# Patient Record
Sex: Female | Born: 1980 | Race: Black or African American | Hispanic: No | Marital: Married | Smoking: Never smoker
Health system: Southern US, Community
[De-identification: ages and names within clinical notes are randomized; demographics above are authoritative.]

---

## 2016-02-27 ENCOUNTER — Emergency Department: Payer: Medicaid Other

## 2016-02-27 ENCOUNTER — Encounter: Payer: Self-pay | Admitting: Radiology

## 2016-02-27 ENCOUNTER — Emergency Department
Admission: EM | Admit: 2016-02-27 | Discharge: 2016-02-27 | Disposition: A | Discharge status: Home / Self Care | Payer: Medicaid Other | Attending: Emergency Medicine | Admitting: Emergency Medicine

## 2016-02-27 DIAGNOSIS — O26892 Other specified pregnancy related conditions, second trimester: Secondary | ICD-10-CM | POA: Diagnosis not present

## 2016-02-27 DIAGNOSIS — N9489 Other specified conditions associated with female genital organs and menstrual cycle: Secondary | ICD-10-CM | POA: Insufficient documentation

## 2016-02-27 DIAGNOSIS — R8271 Bacteriuria: Secondary | ICD-10-CM | POA: Insufficient documentation

## 2016-02-27 DIAGNOSIS — O4692 Antepartum hemorrhage, unspecified, second trimester: Secondary | ICD-10-CM

## 2016-02-27 DIAGNOSIS — O9989 Other specified diseases and conditions complicating pregnancy, childbirth and the puerperium: Secondary | ICD-10-CM

## 2016-02-27 DIAGNOSIS — O99891 Other specified diseases and conditions complicating pregnancy: Secondary | ICD-10-CM

## 2016-02-27 DIAGNOSIS — Z3A22 22 weeks gestation of pregnancy: Secondary | ICD-10-CM | POA: Insufficient documentation

## 2016-02-27 DIAGNOSIS — O212 Late vomiting of pregnancy: Secondary | ICD-10-CM | POA: Diagnosis present

## 2016-02-27 LAB — BASIC METABOLIC PANEL
ANION GAP: 9 (ref 5–15)
BUN: 12 mg/dL (ref 6–20)
CHLORIDE: 105 mmol/L (ref 101–111)
CO2: 22 mmol/L (ref 22–32)
Calcium: 9 mg/dL (ref 8.9–10.3)
Creatinine, Ser: 0.56 mg/dL (ref 0.44–1.00)
GFR calc Af Amer: >60 mL/min (ref >60)
GLUCOSE: 118 mg/dL — AB (ref 65–99)
POTASSIUM: 3.9 mmol/L (ref 3.5–5.1)
Sodium: 136 mmol/L (ref 135–145)

## 2016-02-27 LAB — URINALYSIS COMPLETE WITH MICROSCOPIC (ARMC ONLY)
BILIRUBIN URINE: NEGATIVE
Glucose, UA: NEGATIVE mg/dL
KETONES UR: NEGATIVE mg/dL
Nitrite: NEGATIVE
Protein, ur: NEGATIVE mg/dL
SPECIFIC GRAVITY, URINE: 1.003 — AB (ref 1.005–1.030)
pH: 7 (ref 5.0–8.0)

## 2016-02-27 LAB — CBC
HEMATOCRIT: 37 % (ref 35.0–47.0)
HEMOGLOBIN: 12.3 g/dL (ref 12.0–16.0)
MCH: 27.1 pg (ref 26.0–34.0)
MCHC: 33.2 g/dL (ref 32.0–36.0)
MCV: 81.7 fL (ref 80.0–100.0)
Platelets: 332 10*3/uL (ref 150–440)
RBC: 4.53 MIL/uL (ref 3.80–5.20)
RDW: 14.5 % (ref 11.5–14.5)
WBC: 10 10*3/uL (ref 3.6–11.0)

## 2016-02-27 LAB — TYPE AND SCREEN
ABO/RH(D): O POS
Antibody Screen: NEGATIVE

## 2016-02-27 LAB — POCT PREGNANCY, URINE: Preg Test, Ur: POSITIVE — AB

## 2016-02-27 LAB — HCG, QUANTITATIVE, PREGNANCY: hCG, Beta Chain, Quant, S: 18406 m[IU]/mL — ABNORMAL HIGH (ref <5)

## 2016-02-27 MED ORDER — NITROFURANTOIN MONOHYD MACRO 100 MG PO CAPS: 100.0000 mg | ORAL_CAPSULE | Freq: Two times a day (BID) | ORAL | 0 refills | Status: AC

## 2016-02-27 NOTE — Discharge Instructions (Signed)
According to our ultrasound you are [redacted] weeks pregnant. Please follow-up with the health department for further evaluation. He is return with any worsening symptoms.

## 2016-02-27 NOTE — ED Triage Notes (Signed)
Pt states that she is having some burning with urination that started today, pt also states that she noticed some blood when she wiped, pt reports recently finding out that she is 14-[redacted] weeks pregnant, lmp '04

## 2016-02-27 NOTE — ED Provider Notes (Signed)
[facility] Emergency Department Provider Note   ____________________________________________   First MD Initiated Contact with Patient 02/27/16 [phone]     (approximate)  I have reviewed the triage vital signs and the nursing notes.   HISTORY  Chief Complaint Vaginal Bleeding    HPI Hailey Walker is a 35 y.o. female who comes into the hospital today with spotting. The patient reports that she wiped and saw that there was some blood on the tissue. The patient reports that she found out last Thursday she was pregnant. She reports that she was around 14-[redacted] weeks pregnant but they were not sure.The patient is a G2P1001. The patient reports that she spotted and then it stopped and then spotting started again. The patient denies any abdominal pain or back pain. She's had no fever or headache. She has had some nausea and vomiting. Given the symptoms she wanted to come in and get checked out.   History reviewed. No pertinent past medical history.  There are no active problems to display for this patient.   History reviewed. No pertinent surgical history.  Prior to Admission medications   Medication Sig Start Date End Date Taking? Authorizing Provider  nitrofurantoin, macrocrystal-monohydrate, (MACROBID) 100 MG capsule Take 1 capsule (100 mg total) by mouth 2 (two) times daily. 02/27/16 03/05/16  Rebecka Apley, MD    Allergies Review of patient's allergies indicates no known allergies.  No family history on file.  Social History Social History  Substance Use Topics  . Smoking status: Never Smoker  . Smokeless tobacco: Not on file  . Alcohol use No    Review of Systems Constitutional: No fever/chills Eyes: No visual changes. ENT: No sore throat. Cardiovascular: Denies chest pain. Respiratory: Denies shortness of breath. Gastrointestinal: No abdominal pain.  No nausea, no vomiting.  No diarrhea.  No constipation. Genitourinary: Vaginal  bleeding Musculoskeletal: Negative for back pain. Skin: Negative for rash. Neurological: Negative for headaches, focal weakness or numbness.  10-point ROS otherwise negative.  ____________________________________________   PHYSICAL EXAM:  VITAL SIGNS: ED Triage Vitals  Enc Vitals Group     BP 02/27/16 0118 137/84     Pulse Rate 02/27/16 0118 (!) 103     Resp 02/27/16 0118 18     Temp 02/27/16 0118 98.7 F (37.1 C)     Temp Source 02/27/16 0118 Oral     SpO2 02/27/16 0118 98 %     Weight 02/27/16 0124 248 lb (112.5 kg)     Height 02/27/16 0124 4\' 9"  (1.448 m)     Head Circumference --      Peak Flow --      Pain Score --      Pain Loc --      Pain Edu? --      Excl. in GC? --     Constitutional: Alert and oriented. Well appearing and in no acute distress. Eyes: Conjunctivae are normal. PERRL. EOMI. Head: Atraumatic. Nose: No congestion/rhinnorhea. Mouth/Throat: Mucous membranes are moist.  Oropharynx non-erythematous. Cardiovascular: Normal rate, regular rhythm. Grossly normal heart sounds.  Good peripheral circulation. Respiratory: Normal respiratory effort.  No retractions. Lungs CTAB. Gastrointestinal: Soft and nontender. No distention. Positive bowel sounds Musculoskeletal: No lower extremity tenderness nor edema.   Neurologic:  Normal speech and language.  Skin:  Skin is warm, dry and intact.  Psychiatric: Mood and affect are normal.   ____________________________________________   LABS (all labs ordered are listed, but only abnormal results are displayed)  Labs Reviewed  URINALYSIS COMPLETEWITH MICROSCOPIC ([facility] ONLY) - Abnormal; Notable for the following:       Result Value   Color, Urine YELLOW (*)    APPearance CLOUDY (*)    Specific Gravity, Urine 1.003 (*)    Hgb urine dipstick 2+ (*)    Leukocytes, UA 2+ (*)    Bacteria, UA MANY (*)    Squamous Epithelial / LPF 6-30 (*)    All other components within normal limits  HCG, QUANTITATIVE, PREGNANCY  - Abnormal; Notable for the following:    hCG, Beta Chain, Quant, S 18,406 (*)    All other components within normal limits  BASIC METABOLIC PANEL - Abnormal; Notable for the following:    Glucose, Bld 118 (*)    All other components within normal limits  POCT PREGNANCY, URINE - Abnormal; Notable for the following:    Preg Test, Ur POSITIVE (*)    All other components within normal limits  CBC  POC URINE PREG, ED  TYPE AND SCREEN   ____________________________________________  EKG  none ____________________________________________  RADIOLOGY  US pelvis ____________________________________________   PROCEDURES  Procedure(s) performed: None  Procedures  Critical Care performed: No  ____________________________________________   INITIAL IMPRESSION / ASSESSMENT AND PLAN / ED COURSE  Pertinent labs & imaging results that were available during my care of the patient were reviewed by me and considered in my medical decision making (see chart for details).  This is a 35 year old female who comes into the hospital today with some vaginal spotting. The patient is 14-16 weeks' pregnant. She denies any pain at this time. Looking at the patient's urinalysis appears as though she has a urinary tract infection or at least asymptomatic bacteriuria. I will send the patient for an ultrasound as well as perform a pelvic exam. I will prescribe the patient some nitrofurantoin and have her follow back up with her OB/GYN. The patient will be reassessed once I received the results of her ultrasound.  Clinical Course  Value Comment By Time  US OB Limited Single live intrauterine pregnancy. Estimated gestational age of [redacted] weeks and 2 days by BPD. No acute process identified.  This exam is performed on an emergent basis and does not comprehensively evaluate fetal size, dating, or anatomy; follow-up complete OB US should be considered if further fetal assessment is warranted.   Rebecka Apley, MD 10/02 [phone]   I discussed the results of the ultrasound with the patient. She reports that she did not have accurate dates. She reports that they simply listen to her fetal heart tones and felt her uterus size and estimated gestational age. Given that the patient is in her second trimester I will hold off on doing a pelvic exam as to not introduce infection or cause more bleeding. The patient will be treated for asymptomatic bacteriuria and she has no further bleeding or pain at this time. I will have the patient evaluated as she has an appointment on Thursday. The patient will be discharged home to follow-up with her OB/GYN.  ____________________________________________   FINAL CLINICAL IMPRESSION(S) / ED DIAGNOSES  Final diagnoses:  Asymptomatic bacteriuria during pregnancy  Vaginal bleeding in pregnancy, second trimester      NEW MEDICATIONS STARTED DURING THIS VISIT:  Discharge Medication List as of 02/27/2016  7:30 AM    START taking these medications   Details  nitrofurantoin, macrocrystal-monohydrate, (MACROBID) 100 MG capsule Take 1 capsule (100 mg total) by mouth 2 (two) times daily., Starting Mon 02/27/2016, Until Mon 03/05/2016,  Print         Note:  This document was prepared using Dragon voice recognition software and may include unintentional dictation errors.    Rebecka ApleyAllison P Abeer Deskins, MD 02/27/16 [phone]

## 2018-08-23 IMAGING — US US OB LIMITED
1 series · 14 of 28 positions shown · non-contrast
Comparison: none

CLINICAL DATA: 35 y/o F; pregnant patient with spotting since
yesterday afternoon.

EXAM:
LIMITED OBSTETRIC ULTRASOUND

[Series 1: us ob limited · 0.30mm/px · 14 of 35 slices shown]
[im 2/35]
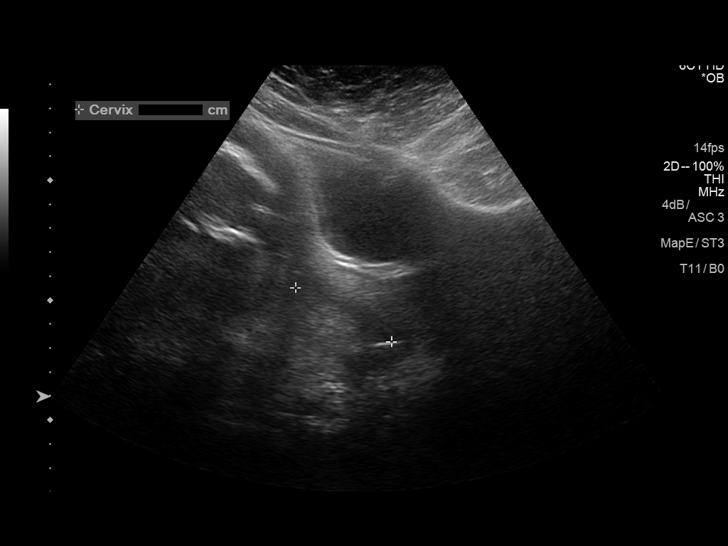
[im 4/35]
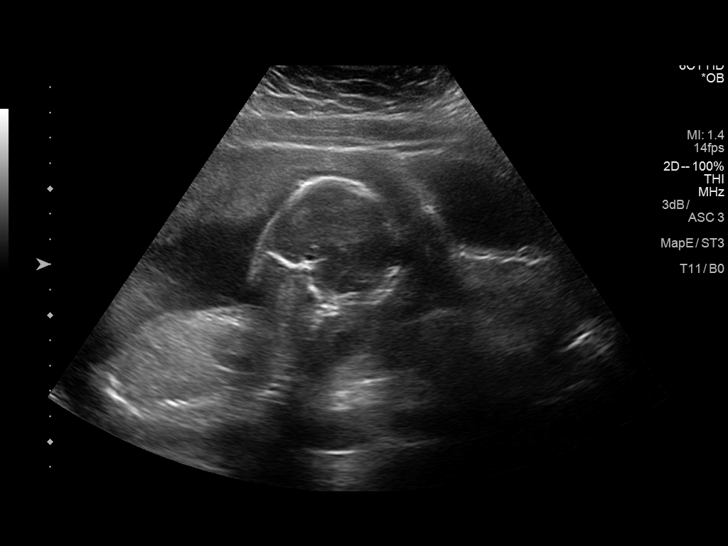
[im 7/35]
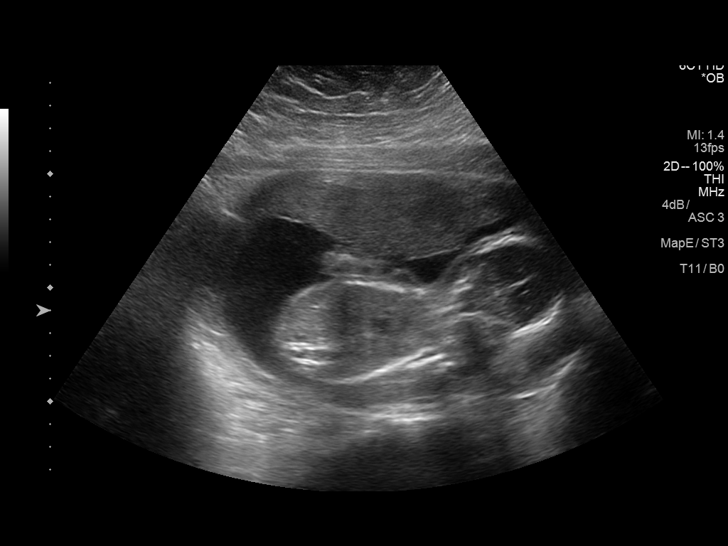
[im 9/35]
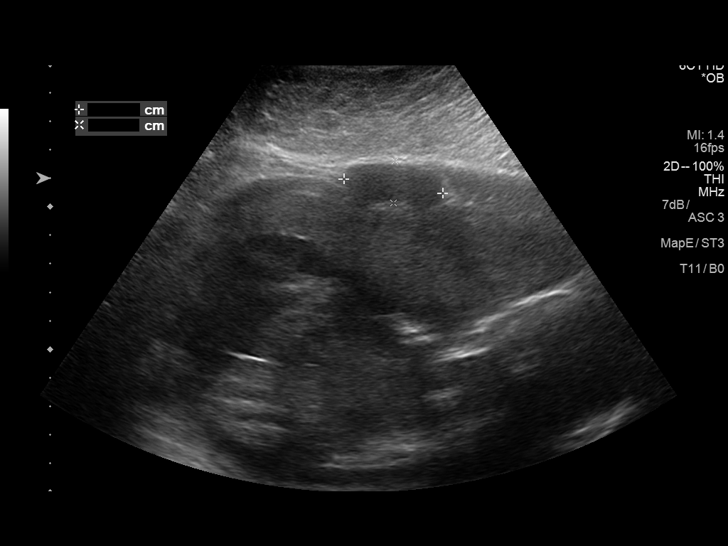
[im 12/35]
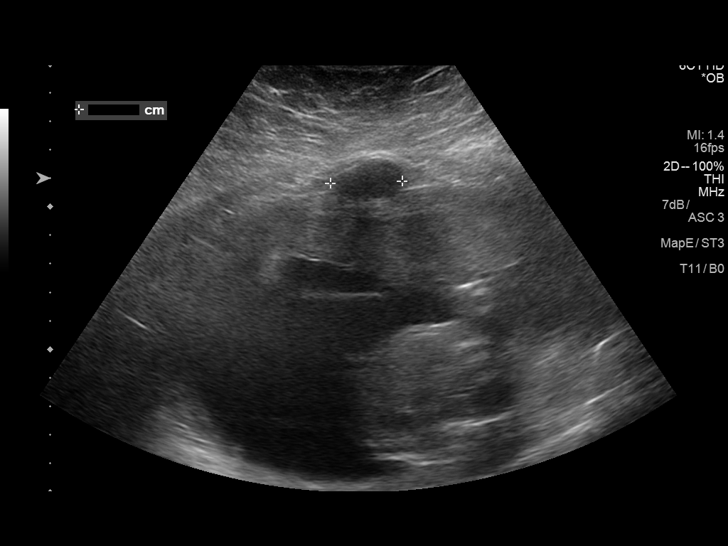
[im 14/35]
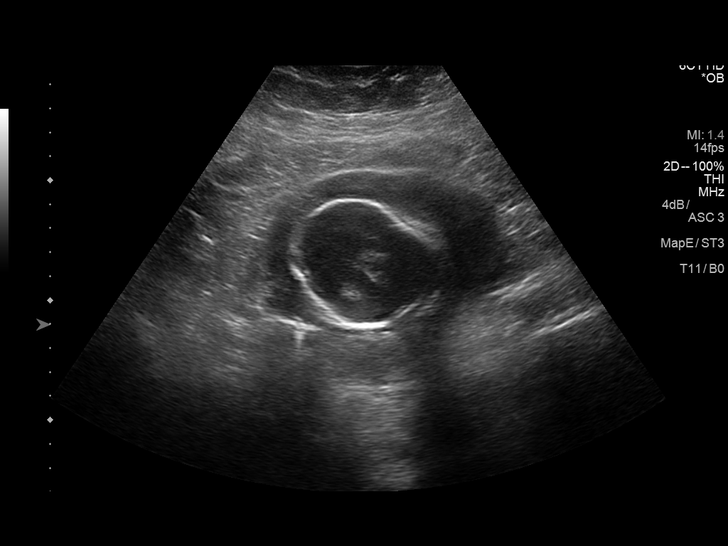
[im 17/35]
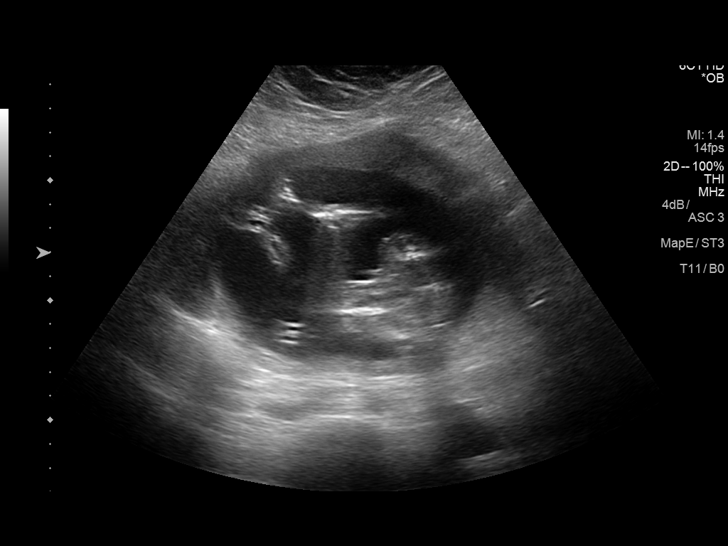
[im 19/35]
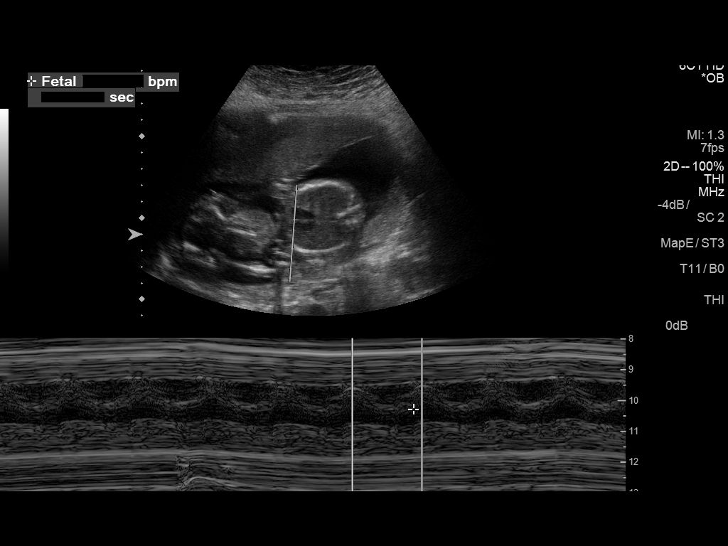
[im 22/35]
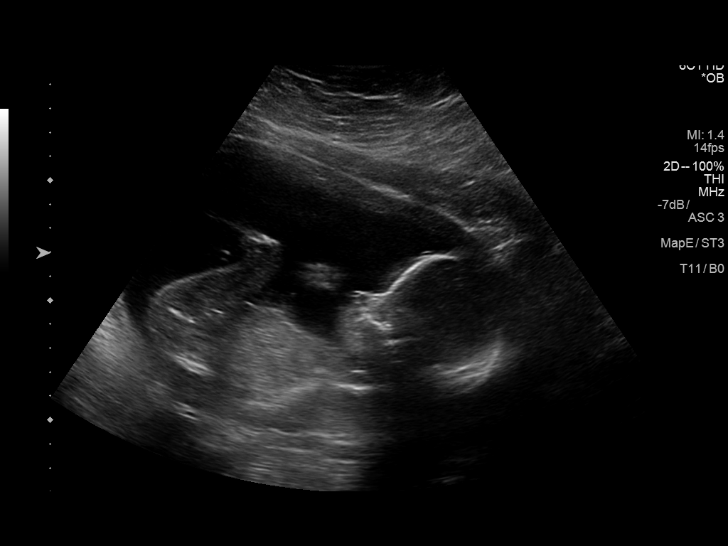
[im 24/35]
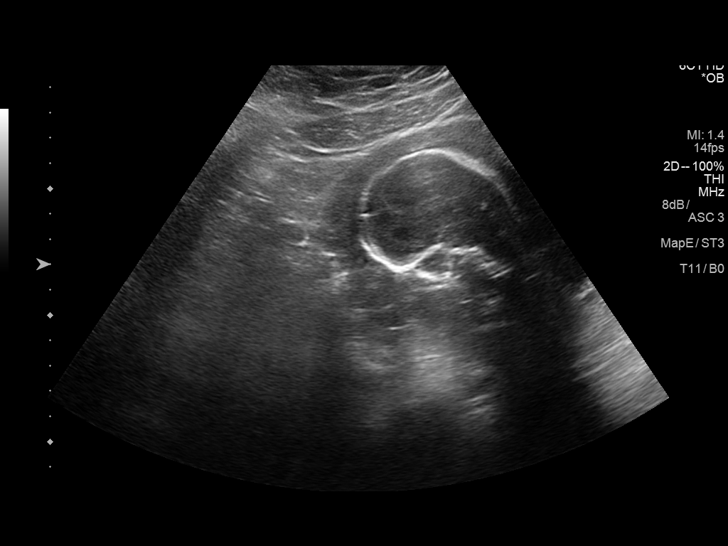
[im 27/35]
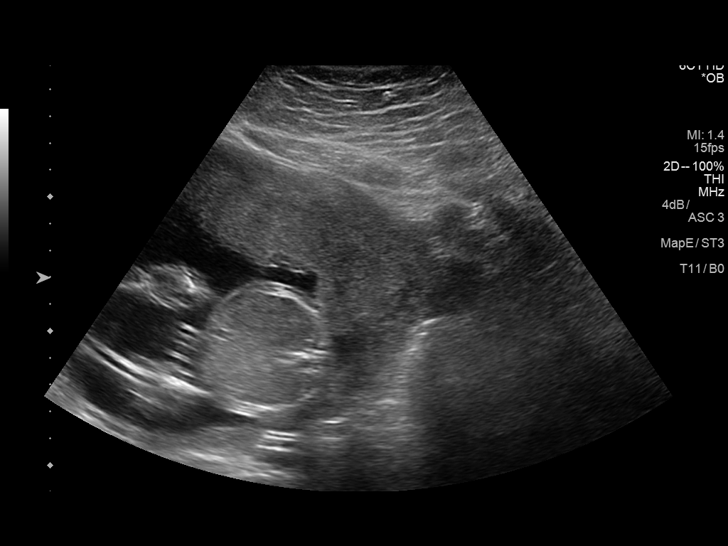
[im 29/35]
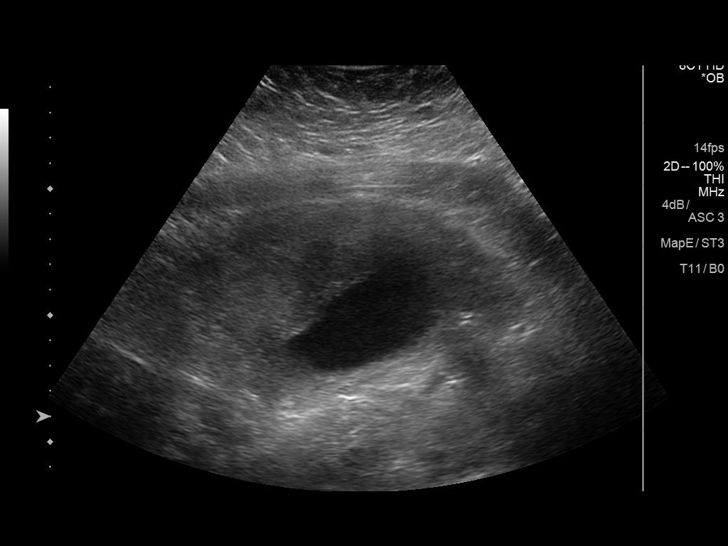
[im 32/35]
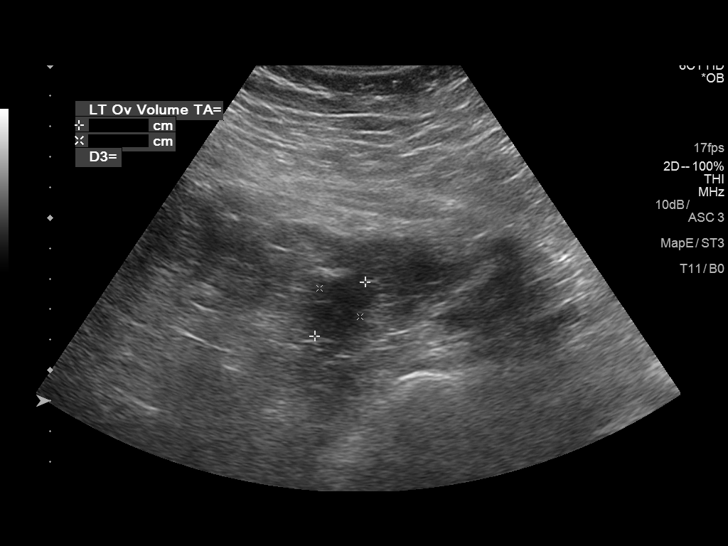
[im 35/35]
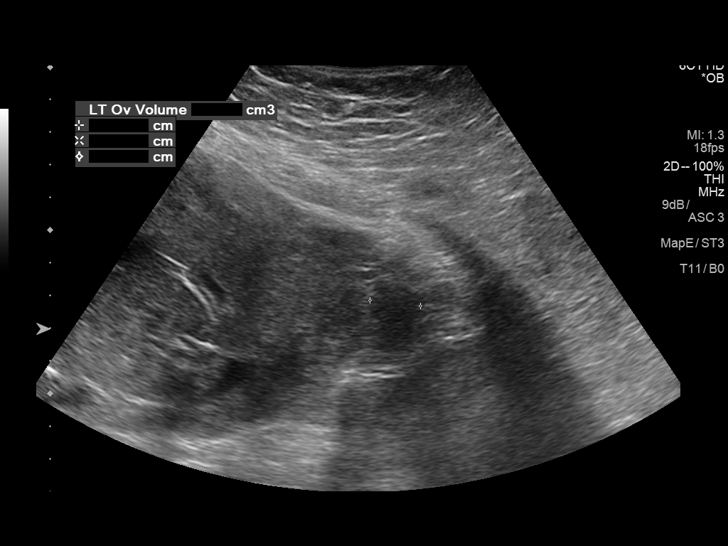

[14 of 28 positions shown; findings below may reference images not displayed]

FINDINGS: Number of Fetuses:  1

Heart Rate:  147 bpm

Movement:  Present

Presentation: Cephalic

Placental Location: Anterior

Previa: No

Amniotic Fluid (Subjective):  Within normal limits.

BPD:  5.4cm 22w  2d

MATERNAL FINDINGS:

Cervix:  Appears closed.

Uterus/Adnexae: Right ovary not seen. Left ovary is normal.
Hypoechoic focus measuring 3.5 x 1.5 x 2.5 cm of the anterior
serosal surface of the uterus with exophytic extension, question sub
serosal fibroid.
IMPRESSION: Single live intrauterine pregnancy. Estimated gestational age of 22
weeks and 2 days by BPD. No acute process identified.

This exam is performed on an emergent basis and does not
comprehensively evaluate fetal size, dating, or anatomy; follow-up
complete OB US should be considered if further fetal assessment is
warranted.

By: Iyasu Adami M.D.
